# Patient Record
Sex: Female | Born: 1975 | Race: White | Hispanic: No | Marital: Single | Smoking: Current every day smoker
Health system: Southern US, Community
[De-identification: ages and names within clinical notes are randomized; demographics above are authoritative.]

## PROBLEM LIST (undated history)

## (undated) DIAGNOSIS — D3500 Benign neoplasm of unspecified adrenal gland: Secondary | ICD-10-CM

---

## 2001-12-20 ENCOUNTER — Ambulatory Visit: Admission: RE | Admit: 2001-12-20 | Discharge: 2001-12-20 | Payer: Self-pay | Admitting: Gynecology

## 2016-01-20 ENCOUNTER — Emergency Department (HOSPITAL_COMMUNITY): Payer: BLUE CROSS/BLUE SHIELD

## 2016-01-20 ENCOUNTER — Emergency Department (HOSPITAL_COMMUNITY)
Admission: EM | Admit: 2016-01-20 | Discharge: 2016-01-20 | Disposition: A | Discharge status: Home / Self Care | Payer: BLUE CROSS/BLUE SHIELD | Attending: Emergency Medicine | Admitting: Emergency Medicine

## 2016-01-20 ENCOUNTER — Encounter (HOSPITAL_COMMUNITY): Payer: Self-pay

## 2016-01-20 DIAGNOSIS — F17200 Nicotine dependence, unspecified, uncomplicated: Secondary | ICD-10-CM | POA: Diagnosis not present

## 2016-01-20 DIAGNOSIS — Y998 Other external cause status: Secondary | ICD-10-CM | POA: Insufficient documentation

## 2016-01-20 DIAGNOSIS — Y9389 Activity, other specified: Secondary | ICD-10-CM | POA: Diagnosis not present

## 2016-01-20 DIAGNOSIS — S29001A Unspecified injury of muscle and tendon of front wall of thorax, initial encounter: Secondary | ICD-10-CM | POA: Diagnosis not present

## 2016-01-20 DIAGNOSIS — R55 Syncope and collapse: Secondary | ICD-10-CM | POA: Diagnosis not present

## 2016-01-20 DIAGNOSIS — Y92410 Unspecified street and highway as the place of occurrence of the external cause: Secondary | ICD-10-CM | POA: Diagnosis not present

## 2016-01-20 DIAGNOSIS — M546 Pain in thoracic spine: Secondary | ICD-10-CM

## 2016-01-20 DIAGNOSIS — S0990XA Unspecified injury of head, initial encounter: Secondary | ICD-10-CM | POA: Insufficient documentation

## 2016-01-20 DIAGNOSIS — Z86018 Personal history of other benign neoplasm: Secondary | ICD-10-CM | POA: Diagnosis not present

## 2016-01-20 DIAGNOSIS — S8011XA Contusion of right lower leg, initial encounter: Secondary | ICD-10-CM | POA: Diagnosis not present

## 2016-01-20 DIAGNOSIS — S199XXA Unspecified injury of neck, initial encounter: Secondary | ICD-10-CM | POA: Diagnosis present

## 2016-01-20 DIAGNOSIS — S3992XA Unspecified injury of lower back, initial encounter: Secondary | ICD-10-CM | POA: Diagnosis not present

## 2016-01-20 DIAGNOSIS — S161XXA Strain of muscle, fascia and tendon at neck level, initial encounter: Secondary | ICD-10-CM | POA: Insufficient documentation

## 2016-01-20 HISTORY — DX: Benign neoplasm of unspecified adrenal gland: D35.00

## 2016-01-20 MED ORDER — TRAMADOL HCL 50 MG PO TABS: 50.0000 mg | ORAL_TABLET | Freq: Two times a day (BID) | ORAL | Status: AC | PRN

## 2016-01-20 MED ORDER — IBUPROFEN 800 MG PO TABS: 800.0000 mg | ORAL_TABLET | Freq: Three times a day (TID) | ORAL | Status: AC

## 2016-01-20 MED ORDER — CYCLOBENZAPRINE HCL 10 MG PO TABS: 10.0000 mg | ORAL_TABLET | Freq: Two times a day (BID) | ORAL | Status: AC | PRN

## 2016-01-20 MED ORDER — IBUPROFEN 400 MG PO TABS
800.0000 mg | ORAL_TABLET | Freq: Once | ORAL | Status: AC
  Administered 2016-01-20: 800 mg via ORAL
  Filled 2016-01-20: qty 2

## 2016-01-20 MED ORDER — CYCLOBENZAPRINE HCL 10 MG PO TABS
10.0000 mg | ORAL_TABLET | Freq: Once | ORAL | Status: AC
  Administered 2016-01-20: 10 mg via ORAL
  Filled 2016-01-20: qty 1
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
## 2016-01-20 NOTE — Discharge Instructions (Signed)
Back Pain, Adult °Back pain is very common in adults. The cause of back pain is rarely dangerous and the pain often gets better over time. The cause of your back pain may not be known. Some common causes of back pain include: °· Strain of the muscles or ligaments supporting the spine. °· Wear and tear (degeneration) of the spinal disks. °· Arthritis. °· Direct injury to the back. °For many people, back pain may return. Since back pain is rarely dangerous, most people can learn to manage this condition on their own. °HOME CARE INSTRUCTIONS °Watch your back pain for any changes. The following actions may help to lessen any discomfort you are feeling: °· Remain active. It is stressful on your back to sit or stand in one place for long periods of time. Do not sit, drive, or stand in one place for more than 30 minutes at a time. Take short walks on even surfaces as soon as you are able. Try to increase the length of time you walk each day. °· Exercise regularly as directed by your health care provider. Exercise helps your back heal faster. It also helps avoid future injury by keeping your muscles strong and flexible. °· Do not stay in bed. Resting more than 1-2 days can delay your recovery. °· Pay attention to your body when you bend and lift. The most comfortable positions are those that put less stress on your recovering back. Always use proper lifting techniques, including: °· Bending your knees. °· Keeping the load close to your body. °· Avoiding twisting. °· Find a comfortable position to sleep. Use a firm mattress and lie on your side with your knees slightly bent. If you lie on your back, put a pillow under your knees. °· Avoid feeling anxious or stressed. Stress increases muscle tension and can worsen back pain. It is important to recognize when you are anxious or stressed and learn ways to manage it, such as with exercise. °· Take medicines only as directed by your health care provider. Over-the-counter  medicines to reduce pain and inflammation are often the most helpful. Your health care provider may prescribe muscle relaxant drugs. These medicines help dull your pain so you can more quickly return to your normal activities and healthy exercise. °· Apply ice to the injured area: °· Put ice in a plastic bag. °· Place a towel between your skin and the bag. °· Leave the ice on for 20 minutes, 2-3 times a day for the first 2-3 days. After that, ice and heat may be alternated to reduce pain and spasms. °· Maintain a healthy weight. Excess weight puts extra stress on your back and makes it difficult to maintain good posture. °SEEK MEDICAL CARE IF: °· You have pain that is not relieved with rest or medicine. °· You have increasing pain going down into the legs or buttocks. °· You have pain that does not improve in one week. °· You have night pain. °· You lose weight. °· You have a fever or chills. °SEEK IMMEDIATE MEDICAL CARE IF:  °· You develop new bowel or bladder control problems. °· You have unusual weakness or numbness in your arms or legs. °· You develop nausea or vomiting. °· You develop abdominal pain. °· You feel faint. °  °This information is not intended to replace advice given to you by your health care provider. Make sure you discuss any questions you have with your health care provider. °  °Document Released: 08/16/2005 Document Revised: 09/06/2014 Document Reviewed: 12/18/2013 °Elsevier Interactive Patient Education ©2016 Elsevier   Inc[employer]  Cervical Sprain A cervical sprain is an injury in the neck in which the strong, fibrous tissues (ligaments) that connect your neck bones stretch or tear. Cervical sprains can range from mild to severe. Severe cervical sprains can cause the neck vertebrae to be unstable. This can lead to damage of the spinal cord and can result in serious nervous system problems. The amount of time it takes for a cervical sprain to get better depends on the cause and extent of the injury.  Most cervical sprains heal in 1 to 3 weeks. CAUSES  Severe cervical sprains may be caused by:   Contact sport injuries (such as from football, rugby, wrestling, hockey, auto racing, gymnastics, diving, martial arts, or boxing).   Motor vehicle collisions.   Whiplash injuries. This is an injury from a sudden forward and backward whipping movement of the head and neck.  Falls.  Mild cervical sprains may be caused by:   Being in an awkward position, such as while cradling a telephone between your ear and shoulder.   Sitting in a chair that does not offer proper support.   Working at a poorly Landscape architect station.   Looking up or down for long periods of time.  SYMPTOMS   Pain, soreness, stiffness, or a burning sensation in the front, back, or sides of the neck. This discomfort may develop immediately after the injury or slowly, 24 hours or more after the injury.   Pain or tenderness directly in the middle of the back of the neck.   Shoulder or upper back pain.   Limited ability to move the neck.   Headache.   Dizziness.   Weakness, numbness, or tingling in the hands or arms.   Muscle spasms.   Difficulty swallowing or chewing.   Tenderness and swelling of the neck.  DIAGNOSIS  Most of the time your health care provider can diagnose a cervical sprain by taking your history and doing a physical exam. Your health care provider will ask about previous neck injuries and any known neck problems, such as arthritis in the neck. X-rays may be taken to find out if there are any other problems, such as with the bones of the neck. Other tests, such as a CT scan or MRI, may also be needed.  TREATMENT  Treatment depends on the severity of the cervical sprain. Mild sprains can be treated with rest, keeping the neck in place (immobilization), and pain medicines. Severe cervical sprains are immediately immobilized. Further treatment is done to help with pain, muscle  spasms, and other symptoms and may include:  Medicines, such as pain relievers, numbing medicines, or muscle relaxants.   Physical therapy. This may involve stretching exercises, strengthening exercises, and posture training. Exercises and improved posture can help stabilize the neck, strengthen muscles, and help stop symptoms from returning.  HOME CARE INSTRUCTIONS   Put ice on the injured area.   Put ice in a plastic bag.   Place a towel between your skin and the bag.   Leave the ice on for 15-20 minutes, 3-4 times a day.   If your injury was severe, you may have been given a cervical collar to wear. A cervical collar is a two-piece collar designed to keep your neck from moving while it heals.  Do not remove the collar unless instructed by your health care provider.  If you have long hair, keep it outside of the collar.  Ask your health care provider before making any adjustments to  your collar. Minor adjustments may be required over time to improve comfort and reduce pressure on your chin or on the back of your head.  Ifyou are allowed to remove the collar for cleaning or bathing, follow your health care provider's instructions on how to do so safely.  Keep your collar clean by wiping it with mild soap and water and drying it completely. If the collar you have been given includes removable pads, remove them every 1-2 days and hand wash them with soap and water. Allow them to air dry. They should be completely dry before you wear them in the collar.  If you are allowed to remove the collar for cleaning and bathing, wash and dry the skin of your neck. Check your skin for irritation or sores. If you see any, tell your health care provider.  Do not drive while wearing the collar.   Only take over-the-counter or prescription medicines for pain, discomfort, or fever as directed by your health care provider.   Keep all follow-up appointments as directed by your health care  provider.   Keep all physical therapy appointments as directed by your health care provider.   Make any needed adjustments to your workstation to promote good posture.   Avoid positions and activities that make your symptoms worse.   Warm up and stretch before being active to help prevent problems.  SEEK MEDICAL CARE IF:   Your pain is not controlled with medicine.   You are unable to decrease your pain medicine over time as planned.   Your activity level is not improving as expected.  SEEK IMMEDIATE MEDICAL CARE IF:   You develop any bleeding.  You develop stomach upset.  You have signs of an allergic reaction to your medicine.   Your symptoms get worse.   You develop new, unexplained symptoms.   You have numbness, tingling, weakness, or paralysis in any part of your body.  MAKE SURE YOU:   Understand these instructions.  Will watch your condition.  Will get help right away if you are not doing well or get worse.   This information is not intended to replace advice given to you by your health care provider. Make sure you discuss any questions you have with your health care provider.   Document Released: 06/13/2007 Document Revised: 08/21/2013 Document Reviewed: 02/21/2013 Elsevier Interactive Patient Education 2016 [place] A contusion is a deep bruise. Contusions are the result of a blunt injury to tissues and muscle fibers under the skin. The injury causes bleeding under the skin. The skin overlying the contusion may turn blue, purple, or yellow. Minor injuries will give you a painless contusion, but more severe contusions may stay painful and swollen for a few weeks.  CAUSES  This condition is usually caused by a blow, trauma, or direct force to an area of the body. SYMPTOMS  Symptoms of this condition include:  Swelling of the injured area.  Pain and tenderness in the injured area.  Discoloration. The area may have redness and  then turn blue, purple, or yellow. DIAGNOSIS  This condition is diagnosed based on a physical exam and medical history. An X-ray, CT scan, or MRI may be needed to determine if there are any associated injuries, such as broken bones (fractures). TREATMENT  Specific treatment for this condition depends on what area of the body was injured. In general, the best treatment for a contusion is resting, icing, applying pressure to (compression), and elevating the injured area.  This is often called the RICE strategy. Over-the-counter anti-inflammatory medicines may also be recommended for pain control.  HOME CARE INSTRUCTIONS   Rest the injured area.  If directed, apply ice to the injured area:  Put ice in a plastic bag.  Place a towel between your skin and the bag.  Leave the ice on for 20 minutes, 2-3 times per day.  If directed, apply light compression to the injured area using an elastic bandage. Make sure the bandage is not wrapped too tightly. Remove and reapply the bandage as directed by your health care provider.  If possible, raise (elevate) the injured area above the level of your heart while you are sitting or lying down.  Take over-the-counter and prescription medicines only as told by your health care provider. SEEK MEDICAL CARE IF:  Your symptoms do not improve after several days of treatment.  Your symptoms get worse.  You have difficulty moving the injured area. SEEK IMMEDIATE MEDICAL CARE IF:   You have severe pain.  You have numbness in a hand or foot.  Your hand or foot turns pale or cold.   This information is not intended to replace advice given to you by your health care provider. Make sure you discuss any questions you have with your health care provider.   Document Released: 05/26/2005 Document Revised: 05/07/2015 Document Reviewed: 01/01/2015 Elsevier Interactive Patient Education 2016 [employer].  Technical brewer It is common to have multiple  bruises and sore muscles after a motor vehicle collision (MVC). These tend to feel worse for the first 24 hours. You may have the most stiffness and soreness over the first several hours. You may also feel worse when you wake up the first morning after your collision. After this point, you will usually begin to improve with each day. The speed of improvement often depends on the severity of the collision, the number of injuries, and the location and nature of these injuries. HOME CARE INSTRUCTIONS  Put ice on the injured area.  Put ice in a plastic bag.  Place a towel between your skin and the bag.  Leave the ice on for 15-20 minutes, 3-4 times a day, or as directed by your health care provider.  Drink enough fluids to keep your urine clear or pale yellow. Do not drink alcohol.  Take a warm shower or bath once or twice a day. This will increase blood flow to sore muscles.  You may return to activities as directed by your caregiver. Be careful when lifting, as this may aggravate neck or back pain.  Only take over-the-counter or prescription medicines for pain, discomfort, or fever as directed by your caregiver. Do not use aspirin. This may increase bruising and bleeding. SEEK IMMEDIATE MEDICAL CARE IF:  You have numbness, tingling, or weakness in the arms or legs.  You develop severe headaches not relieved with medicine.  You have severe neck pain, especially tenderness in the middle of the back of your neck.  You have changes in bowel or bladder control.  There is increasing pain in any area of the body.  You have shortness of breath, light-headedness, dizziness, or fainting.  You have chest pain.  You feel sick to your stomach (nauseous), throw up (vomit), or sweat.  You have increasing abdominal discomfort.  There is blood in your urine, stool, or vomit.  You have pain in your shoulder (shoulder strap areas).  You feel your symptoms are getting worse. MAKE SURE  YOU:  Understand  these instructions.  Will watch your condition.  Will get help right away if you are not doing well or get worse.   This information is not intended to replace advice given to you by your health care provider. Make sure you discuss any questions you have with your health care provider.   Document Released: 08/16/2005 Document Revised: 09/06/2014 Document Reviewed: 01/13/2011 Elsevier Interactive Patient Education [employer].

## 2016-01-20 NOTE — ED Notes (Signed)
Pt is in stable condition upon d/c and ambulates from ED. 
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
## 2016-05-15 ENCOUNTER — Emergency Department (HOSPITAL_COMMUNITY)
Admission: EM | Admit: 2016-05-15 | Discharge: 2016-05-15 | Disposition: A | Discharge status: Home / Self Care | Payer: BLUE CROSS/BLUE SHIELD | Attending: Emergency Medicine | Admitting: Emergency Medicine

## 2016-05-15 ENCOUNTER — Encounter (HOSPITAL_COMMUNITY): Payer: Self-pay

## 2016-05-15 DIAGNOSIS — R51 Headache: Secondary | ICD-10-CM | POA: Diagnosis not present

## 2016-05-15 DIAGNOSIS — G8918 Other acute postprocedural pain: Secondary | ICD-10-CM | POA: Insufficient documentation

## 2016-05-15 DIAGNOSIS — F17200 Nicotine dependence, unspecified, uncomplicated: Secondary | ICD-10-CM | POA: Diagnosis not present

## 2016-05-15 DIAGNOSIS — G971 Other reaction to spinal and lumbar puncture: Secondary | ICD-10-CM

## 2016-05-15 LAB — COMPREHENSIVE METABOLIC PANEL
ALBUMIN: 3.2 g/dL — AB (ref 3.5–5.0)
ALK PHOS: 58 U/L (ref 38–126)
ALT: 20 U/L (ref 14–54)
AST: 22 U/L (ref 15–41)
Anion gap: 9 (ref 5–15)
BUN: 10 mg/dL (ref 6–20)
CALCIUM: 9 mg/dL (ref 8.9–10.3)
CHLORIDE: 105 mmol/L (ref 101–111)
CO2: 25 mmol/L (ref 22–32)
CREATININE: 1.08 mg/dL — AB (ref 0.44–1.00)
GFR calc non Af Amer: >60 mL/min (ref >60)
GLUCOSE: 104 mg/dL — AB (ref 65–99)
Potassium: 3.2 mmol/L — ABNORMAL LOW (ref 3.5–5.1)
SODIUM: 139 mmol/L (ref 135–145)
Total Bilirubin: 0.3 mg/dL (ref 0.3–1.2)
Total Protein: 6.5 g/dL (ref 6.5–8.1)

## 2016-05-15 LAB — CBC WITH DIFFERENTIAL/PLATELET
BASOS PCT: 0 %
Basophils Absolute: 0 10*3/uL (ref 0.0–0.1)
EOS PCT: 1 %
Eosinophils Absolute: 0.1 10*3/uL (ref 0.0–0.7)
HEMATOCRIT: 43.2 % (ref 36.0–46.0)
HEMOGLOBIN: 14.5 g/dL (ref 12.0–15.0)
LYMPHS PCT: 28 %
Lymphs Abs: 2.9 10*3/uL (ref 0.7–4.0)
MCH: 35.8 pg — AB (ref 26.0–34.0)
MCHC: 33.6 g/dL (ref 30.0–36.0)
MCV: 106.7 fL — ABNORMAL HIGH (ref 78.0–100.0)
MONOS PCT: 9 %
Monocytes Absolute: 0.9 10*3/uL (ref 0.1–1.0)
NEUTROS ABS: 6.6 10*3/uL (ref 1.7–7.7)
NEUTROS PCT: 62 %
Platelets: 307 10*3/uL (ref 150–400)
RBC: 4.05 MIL/uL (ref 3.87–5.11)
RDW: 14.1 % (ref 11.5–15.5)
WBC: 10.5 10*3/uL (ref 4.0–10.5)

## 2016-05-15 LAB — I-STAT BETA HCG BLOOD, ED (MC, WL, AP ONLY): I-stat hCG, quantitative: <5 m[IU]/mL (ref <5)

## 2016-05-15 LAB — I-STAT CG4 LACTIC ACID, ED: Lactic Acid, Venous: 1.64 mmol/L (ref 0.5–1.9)

## 2016-05-15 MED ORDER — SODIUM CHLORIDE 0.9 % IV BOLUS (SEPSIS)
1000.0000 mL | Freq: Once | INTRAVENOUS | Status: AC
Start: 2016-05-15 — End: 2016-05-15
  Administered 2016-05-15: 1000 mL via INTRAVENOUS

## 2016-05-15 MED ORDER — POTASSIUM CHLORIDE CRYS ER 20 MEQ PO TBCR
40.0000 meq | EXTENDED_RELEASE_TABLET | Freq: Once | ORAL | Status: AC
  Administered 2016-05-15: 40 meq via ORAL
  Filled 2016-05-15: qty 2

## 2016-05-15 MED ORDER — SODIUM CHLORIDE 0.9 % IV SOLN
500.0000 mg | Freq: Once | INTRAVENOUS | Status: AC
  Administered 2016-05-15: 500 mg via INTRAVENOUS
  Filled 2016-05-15: qty 2

## 2016-05-15 NOTE — ED Provider Notes (Signed)
[place] DEPT Provider Note   CSN: [id number] Arrival date & time: 05/15/16  1620     History   Chief Complaint Chief Complaint  Patient presents with  . Post-op Problem    HPI Courtney Casey is a 40 y.o. female.  HPI   Courtney Casey is a 40 y.o. female, with a history of Benign adrenal tumor, presenting to the ED with Bilateral headache upon sitting up. Patient had lumbar puncture for meningitis rule out on September 13. Procedure was performed at [facility]. LP negative for meningitis. Pain feels like a pressure, rates it 6 out of 10, only present when she sits upright. When laying flat, patient has no pain. Denies fever/chills, neuro deficits, nausea/vomiting, or any other complaints.     Past Medical History:  Diagnosis Date  . Adrenal benign tumor     There are no active problems to display for this patient.   History reviewed. No pertinent surgical history.  OB History    No data available       Home Medications    Prior to Admission medications   Medication Sig Start Date End Date Taking? Authorizing Provider  cyclobenzaprine (FLEXERIL) 10 MG tablet Take 1 tablet (10 mg total) by mouth 2 (two) times daily as needed for muscle spasms. 01/20/16   Delsa Grana, PA-C  ibuprofen (ADVIL,MOTRIN) 800 MG tablet Take 1 tablet (800 mg total) by mouth 3 (three) times daily. 01/20/16   Delsa Grana, PA-C  traMADol (ULTRAM) 50 MG tablet Take 1 tablet (50 mg total) by mouth every 12 (twelve) hours as needed for severe pain. 01/20/16   Delsa Grana, PA-C    Family History History reviewed. No pertinent family history.  Social History Social History  Substance Use Topics  . Smoking status: Current Every Day Smoker    Packs/day: 1.00  . Smokeless tobacco: Never Used  . Alcohol use No     Allergies   Review of patient's allergies indicates no known allergies.   Review of Systems Review of Systems  Constitutional: Negative for chills, diaphoresis  and fever.  Respiratory: Negative for shortness of breath.   Cardiovascular: Negative for chest pain.  Gastrointestinal: Negative for nausea and vomiting.  Neurological: Positive for headaches. Negative for dizziness and light-headedness.  All other systems reviewed and are negative.    Physical Exam Updated Vital Signs BP 133/93 (BP Location: Left Arm)   Pulse (!) 125   Temp 98.7 F (37.1 C) (Oral)   Resp 18   Ht 5\' 5"  (1.651 m)   Wt 81.2 kg   LMP 05/14/2016 (Within Days)   SpO2 98%   BMI 29.79 kg/m   Physical Exam  Constitutional: She is oriented to person, place, and time. She appears well-developed and well-nourished. No distress.  HENT:  Head: Normocephalic and atraumatic.  Eyes: Conjunctivae and EOM are normal. Pupils are equal, round, and reactive to light.  Neck: Neck supple.  Cardiovascular: Normal rate, regular rhythm, normal heart sounds and intact distal pulses.   Pulmonary/Chest: Effort normal and breath sounds normal. No respiratory distress.  Abdominal: Soft. There is no tenderness. There is no guarding.  Musculoskeletal: She exhibits no edema or tenderness.  Full ROM in all extremities and spine. No midline spinal tenderness.   Lymphadenopathy:    She has no cervical adenopathy.  Neurological: She is alert and oriented to person, place, and time. She has normal reflexes.  Patient begins to have a headache when placed in the upright position. No sensory deficits.  Strength 5/5 in all extremities. No gait disturbance. Coordination intact. Cranial nerves III-XII grossly intact. No facial droop.   Skin: Skin is warm and dry. She is not diaphoretic.  Site of the LP was inspected. No swelling, tenderness, erythema, or fluid leakage noted.  Psychiatric: She has a normal mood and affect. Her behavior is normal.  Nursing note and vitals reviewed.    ED Treatments / Results  Labs (all labs ordered are listed, but only abnormal results are displayed) Labs Reviewed   COMPREHENSIVE METABOLIC PANEL - Abnormal; Notable for the following:       Result Value   Potassium 3.2 (*)    Glucose, Bld 104 (*)    Creatinine, Ser 1.08 (*)    Albumin 3.2 (*)    All other components within normal limits  CBC WITH DIFFERENTIAL/PLATELET - Abnormal; Notable for the following:    MCV 106.7 (*)    MCH 35.8 (*)    All other components within normal limits  I-STAT BETA HCG BLOOD, ED ([facility], [facility], [facility] ONLY)  I-STAT CG4 LACTIC ACID, ED    EKG  EKG Interpretation None       Radiology No results found.  Procedures Procedures (including critical care time)  Medications Ordered in ED Medications  caffeine-sodium benzoate ADULT 500 mg in sodium chloride 0.9 % 1,000 mL IVPB (500 mg Intravenous Given 05/15/16 2026)  potassium chloride SA (K-DUR,KLOR-CON) CR tablet 40 mEq (40 mEq Oral Given 05/15/16 1912)  sodium chloride 0.9 % bolus 1,000 mL (1,000 mLs Intravenous New Bag/Given 05/15/16 1948)     Initial Impression / Assessment and Plan / ED Course  I have reviewed the triage vital signs and the nursing notes.  Pertinent labs & imaging results that were available during my care of the patient were reviewed by me and considered in my medical decision making (see chart for details).  Clinical Course    Patient presents with positional headache for the last 3 days following a LP.  Patient has tried to return to [facility] for an epidural blood patch to be placed, however, when she has gone back to the hospital, they have told her that the proper personnel are not available.  7:27 PM Spoke with Dr. Geroge Baseman, Interventional Radiologist, who states that I will need to contact one of the Neuro radiologists. Will want to know if patient has done 48 hours of complete bed rest, tylenol/ibuprofen, and caffeine. 7:39 PM Spoke with Dr. Nevada Crane, Neuro Radiologist, who states pt will have to have the patch done on Monday Sept 18. Will need to have the patch done at the outpatient imaging  center. Have the patient call [phone]  at 7 AM. Will need to be worked into the schedule, usually within 2 hours.  End of shift patient care handoff report given to Antonietta Breach, PA-C. Plan: Finish IV fluids and caffeine infusion. Discharge with instructions for contacting the [facility]:   05/15/16 1624 05/15/16 1625 05/15/16 1858 05/15/16 1901  BP:  133/93 122/85 122/85  Pulse:  (!) 125 112   Resp:  18 15 26   Temp:  98.7 F (37.1 C) 98.3 F (36.8 C)   TempSrc:  Oral Oral   SpO2:  98% 100% 100%  Weight: 81.2 kg     Height: 5\' 5"  (1.651 m)         Final Clinical Impressions(s) / ED Diagnoses   Final diagnoses:  Post lumbar puncture headache    New Prescriptions New Prescriptions  No medications on file     Layla Maw 05/15/16 2028    Malvin Johns, MD 05/15/16 [phone]

## 2016-05-15 NOTE — ED Notes (Signed)
Nurse starting IV will get labs if needed

## 2016-05-15 NOTE — ED Triage Notes (Signed)
Pt was seen at [facility] for ongoing headaches. Spinal tap preformed and pt was tested for meningitis which was negative. Pt reports continuing headaches and reports she is "leaking spinal fluid." Incision site clean.

## 2016-05-15 NOTE — Discharge Instructions (Signed)
Continue complete bed rest, caffeine use, and the use of Tylenol or ibuprofen. Call the number provided to set up an appointment. Call at 7 AM on Monday, September 18.

## 2016-05-17 ENCOUNTER — Other Ambulatory Visit: Payer: Self-pay | Admitting: Internal Medicine

## 2016-05-17 ENCOUNTER — Ambulatory Visit
Admission: RE | Admit: 2016-05-17 | Discharge: 2016-05-17 | Disposition: A | Discharge status: Home / Self Care | Payer: BLUE CROSS/BLUE SHIELD | Source: Ambulatory Visit | Attending: Internal Medicine | Admitting: Internal Medicine

## 2016-05-17 DIAGNOSIS — G971 Other reaction to spinal and lumbar puncture: Secondary | ICD-10-CM

## 2016-05-17 MED ORDER — IOPAMIDOL (ISOVUE-M 200) INJECTION 41%
1.0000 mL | Freq: Once | INTRAMUSCULAR | Status: AC
  Administered 2016-05-17: 1 mL via EPIDURAL

## 2016-05-17 NOTE — Discharge Instructions (Signed)

## 2016-11-15 ENCOUNTER — Other Ambulatory Visit: Payer: Self-pay | Admitting: Internal Medicine

## 2016-11-15 DIAGNOSIS — T148XXA Other injury of unspecified body region, initial encounter: Secondary | ICD-10-CM

## 2016-11-17 ENCOUNTER — Ambulatory Visit
Admission: RE | Admit: 2016-11-17 | Discharge: 2016-11-17 | Disposition: A | Discharge status: Home / Self Care | Payer: 59 | Source: Ambulatory Visit | Attending: Internal Medicine | Admitting: Internal Medicine

## 2016-11-17 DIAGNOSIS — T148XXA Other injury of unspecified body region, initial encounter: Secondary | ICD-10-CM

## 2019-08-13 ENCOUNTER — Emergency Department (HOSPITAL_COMMUNITY)
Admission: EM | Admit: 2019-08-13 | Discharge: 2019-08-13 | Disposition: A | Discharge status: Home / Self Care | Payer: 59 | Attending: Emergency Medicine | Admitting: Emergency Medicine

## 2019-08-13 ENCOUNTER — Emergency Department (HOSPITAL_COMMUNITY): Payer: 59

## 2019-08-13 ENCOUNTER — Other Ambulatory Visit: Payer: Self-pay

## 2019-08-13 ENCOUNTER — Encounter (HOSPITAL_COMMUNITY): Payer: Self-pay

## 2019-08-13 DIAGNOSIS — H547 Unspecified visual loss: Secondary | ICD-10-CM | POA: Diagnosis present

## 2019-08-13 DIAGNOSIS — F17210 Nicotine dependence, cigarettes, uncomplicated: Secondary | ICD-10-CM | POA: Diagnosis not present

## 2019-08-13 DIAGNOSIS — Z79899 Other long term (current) drug therapy: Secondary | ICD-10-CM | POA: Insufficient documentation

## 2019-08-13 DIAGNOSIS — R55 Syncope and collapse: Secondary | ICD-10-CM

## 2019-08-13 DIAGNOSIS — R42 Dizziness and giddiness: Secondary | ICD-10-CM | POA: Insufficient documentation

## 2019-08-13 NOTE — Discharge Instructions (Signed)
Please return for any problem.  Follow-up with your regular care provider as instructed. °

## 2019-08-13 NOTE — ED Triage Notes (Signed)
Pt states she was driving this morning to work, took a puff on her cigarette and immediately lost her vision . Pt states that this lasted maybe 15 seconds. Pt states that she doe snot have a headache now, and has no visual issues at this time. Pt ambulatory without any dizziness or issues.

## 2019-08-13 NOTE — ED Provider Notes (Signed)
[place] DEPT Provider Note   CSN: [id number] Arrival date & time: 08/13/19  [id number]     History Chief Complaint  Patient presents with  . Loss of Vision    Courtney Casey is a 43 y.o. female.  43 year old female with prior medical history as detailed below presents for evaluation of transient near syncopal episode.  Patient reports that at 8 AM this morning she was driving to work.  She suddenly experienced tingling to the neck, face, and head.  Seconds after this " tingling" she felt that her vision narrowed.  She reports blackness in her peripheral vision.  This then transition to her only seen " light."  Symptoms lasted approximately 5 to 10 seconds.  She did not pass out.  She did feel as though she was about to pass out.  She denies associated chest pain, palpitations, shortness of breath, headache, nausea, vomiting, or other specific complaint.  After this transient episode that lasted at most 30 seconds, she then drove to our facility for evaluation.  She has been asymptomatic since.  She denies any current symptoms.    The history is provided by the patient and medical records.  Illness Location:  Transient near syncope Severity:  Mild Onset quality:  Sudden Duration:  7 hours Timing:  Rare Progression:  Resolved Chronicity:  New Associated symptoms: no chest pain, no fever, no headaches, no loss of consciousness, no rhinorrhea, no shortness of breath and no sore throat        Past Medical History:  Diagnosis Date  . Adrenal benign tumor     There are no problems to display for this patient.   History reviewed. No pertinent surgical history.   OB History   No obstetric history on file.     No family history on file.  Social History   Tobacco Use  . Smoking status: Current Every Day Smoker    Packs/day: 1.00  . Smokeless tobacco: Never Used  Substance Use Topics  . Alcohol use: No  . Drug use: Not on file    Home  Medications Prior to Admission medications   Medication Sig Start Date End Date Taking? Authorizing Provider  cyclobenzaprine (FLEXERIL) 10 MG tablet Take 1 tablet (10 mg total) by mouth 2 (two) times daily as needed for muscle spasms. Patient not taking: Reported on 05/15/2016 01/20/16   Delsa Grana, PA-C  ibuprofen (ADVIL,MOTRIN) 800 MG tablet Take 1 tablet (800 mg total) by mouth 3 (three) times daily. Patient taking differently: Take 800 mg by mouth every 6 (six) hours.  01/20/16   Delsa Grana, PA-C  methocarbamol (ROBAXIN) 750 MG tablet Take 750 mg by mouth every 6 (six) hours as needed for muscle pain. 05/11/16   [provider]  sulfamethoxazole-trimethoprim (BACTRIM DS,SEPTRA DS) 800-160 MG tablet Take 1 tablet by mouth 2 (two) times daily. For 10 days (Start date 05/15/16) 05/15/16   [provider]  traMADol (ULTRAM) 50 MG tablet Take 1 tablet (50 mg total) by mouth every 12 (twelve) hours as needed for severe pain. Patient not taking: Reported on 05/15/2016 01/20/16   Delsa Grana, PA-C    Allergies    Varenicline  Review of Systems   Review of Systems  Constitutional: Negative for fever.  HENT: Negative for rhinorrhea and sore throat.   Respiratory: Negative for shortness of breath.   Cardiovascular: Negative for chest pain.  Neurological: Negative for loss of consciousness and headaches.  All other systems reviewed and are negative.  Physical Exam Updated Vital Signs BP 117/88 (BP Location: Left Arm)   Pulse (!) 123   Temp 98.3 F (36.8 C) (Oral)   Resp 16   Ht 5\' 5"  (1.651 m)   Wt 81.6 kg   SpO2 98%   BMI 29.95 kg/m   Physical Exam Vitals and nursing note reviewed.  Constitutional:      General: She is not in acute distress.    Appearance: Normal appearance. She is well-developed.  HENT:     Head: Normocephalic and atraumatic.     Nose: Nose normal.  Eyes:     Conjunctiva/sclera: Conjunctivae normal.     Pupils: Pupils are equal, round, and  reactive to light.  Cardiovascular:     Rate and Rhythm: Normal rate and regular rhythm.     Heart sounds: Normal heart sounds.  Pulmonary:     Effort: Pulmonary effort is normal. No respiratory distress.     Breath sounds: Normal breath sounds.  Abdominal:     General: Abdomen is flat. There is no distension.     Palpations: Abdomen is soft.     Tenderness: There is no abdominal tenderness.  Musculoskeletal:        General: No deformity. Normal range of motion.     Cervical back: Normal range of motion and neck supple.  Skin:    General: Skin is warm and dry.  Neurological:     General: No focal deficit present.     Mental Status: She is alert and oriented to person, place, and time. Mental status is at baseline.     Cranial Nerves: No cranial nerve deficit.     Sensory: No sensory deficit.     Motor: No weakness.     Coordination: Coordination normal.     ED Results / Procedures / Treatments   Labs (all labs ordered are listed, but only abnormal results are displayed) Labs Reviewed - No data to display  EKG None  Radiology CT Head Wo Contrast  Result Date: 08/13/2019 CLINICAL DATA:  Vision loss. EXAM: CT HEAD WITHOUT CONTRAST TECHNIQUE: Contiguous axial images were obtained from the base of the skull through the vertex without intravenous contrast. COMPARISON:  05/12/2016 FINDINGS: Brain: No evidence of acute infarction, hemorrhage, hydrocephalus, extra-axial collection or mass lesion/mass effect. Vascular: No hyperdense vessel or unexpected calcification. Skull: Normal. Negative for fracture or focal lesion. Sinuses/Orbits: No acute finding. Other: None. IMPRESSION: No acute intracranial abnormality Electronically Signed   By: Zetta Bills M.D.   On: 08/13/2019 10:56    Procedures Procedures (including critical care time)  Medications Ordered in ED Medications - No data to display  ED Course  I have reviewed the triage vital signs and the nursing  notes.  Pertinent labs & imaging results that were available during my care of the patient were reviewed by me and considered in my medical decision making (see chart for details).    MDM Rules/Calculators/A&P                      MDM  Screen complete  Courtney Casey was evaluated in Emergency Department on 08/13/2019 for the symptoms described in the history of present illness. She was evaluated in the context of the global COVID-19 pandemic, which necessitated consideration that the patient might be at risk for infection with the SARS-CoV-2 virus that causes COVID-19. Institutional protocols and algorithms that pertain to the evaluation of patients at risk for COVID-19 are in a state of rapid  change based on information released by regulatory bodies including the CDC and federal and state organizations. These policies and algorithms were followed during the patient's care in the ED.   Patient is presenting for evaluation of reported near syncopal symptoms.  Symptoms were transient.  She has not had further complaints since they occurred approximately 7 hours prior.  CT imaging performed in the ED does not show any acute process.  Patient was offered additional laboratory testing (labs, ekg, etc) and observation by myself.  She declined same.    She desires discharge.  She does understand the need for close follow-up.  Strict return precautions given and understood.    Final Clinical Impression(s) / ED Diagnoses Final diagnoses:  Near syncope    Rx / DC Orders ED Discharge Orders    None       Valarie Merino, MD 08/13/19 1601

## 2020-03-19 IMAGING — CT CT HEAD W/O CM
3 series · 16 of 47 positions shown, 19 images · non-contrast
Comparison: 05/12/2016

CLINICAL DATA: Vision loss.

EXAM:
CT HEAD WITHOUT CONTRAST
TECHNIQUE: Contiguous axial images were obtained from the base of the skull
through the vertex without intravenous contrast.

[Series 2: head wo · axial · 0.45mm/px · z∈[+1209,+1334]mm · 10 of 30 slices shown, 13 images]
[im 3/30  brain]
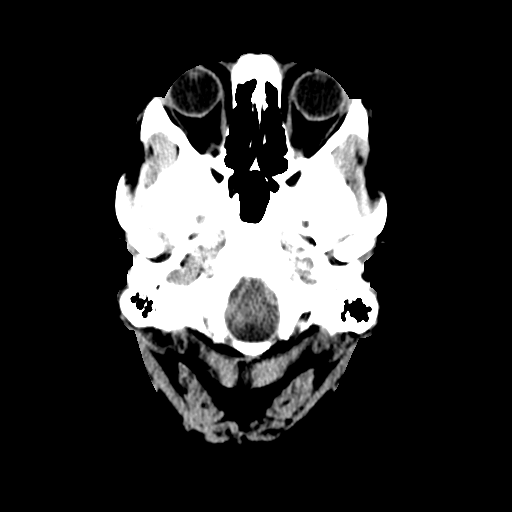
[im 3/30  bone]
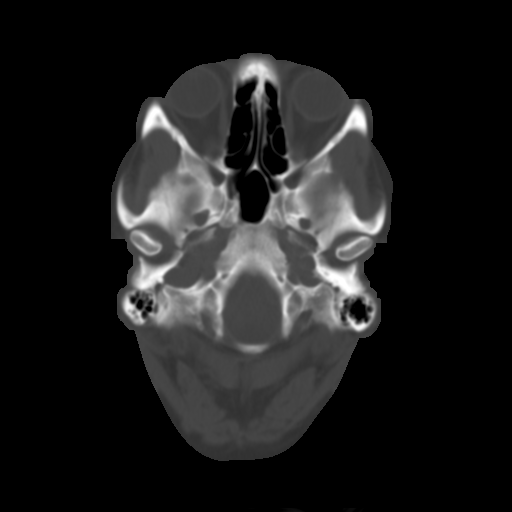
[im 6/30  brain]
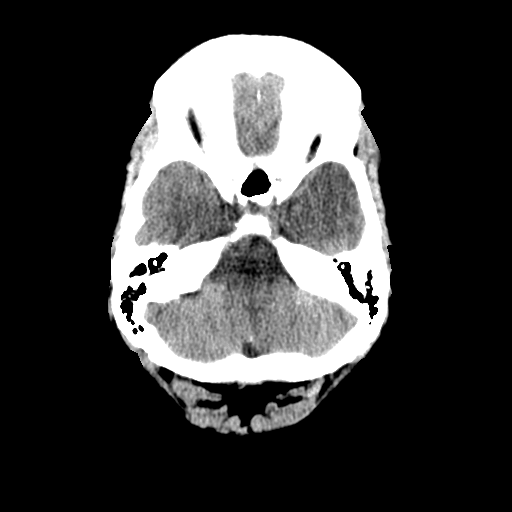
[im 9/30  brain]
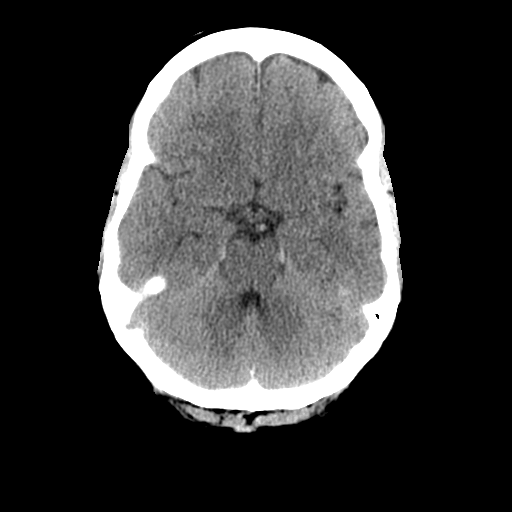
[im 11/30  brain]
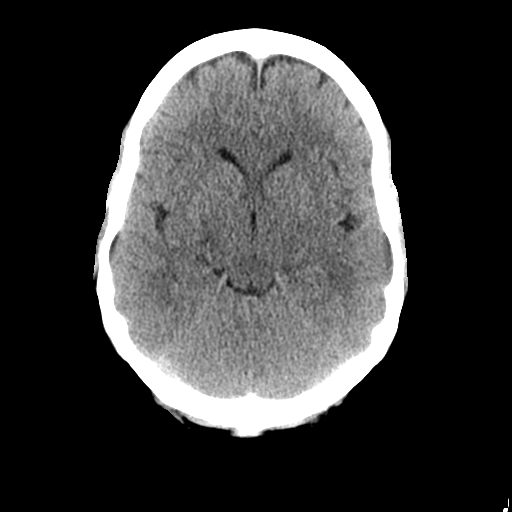
[im 14/30  brain]
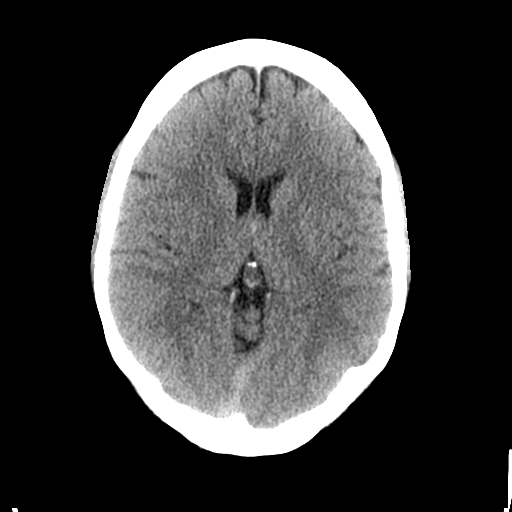
[im 14/30  bone]
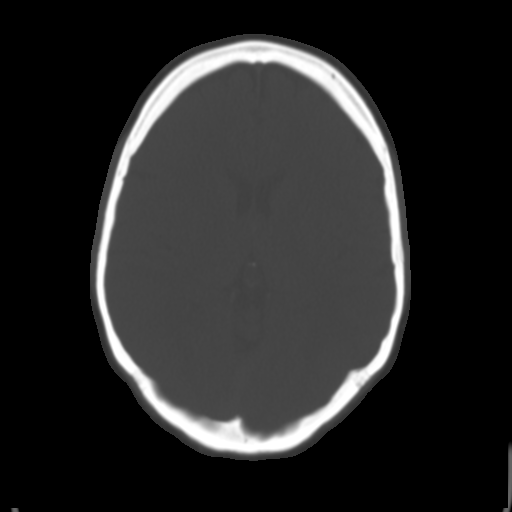
[im 17/30  brain]
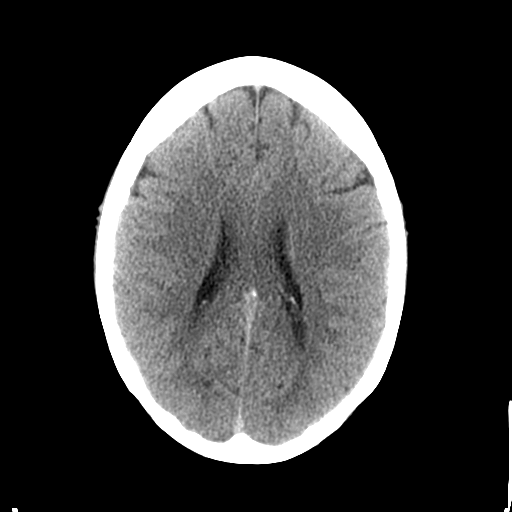
[im 20/30  brain]
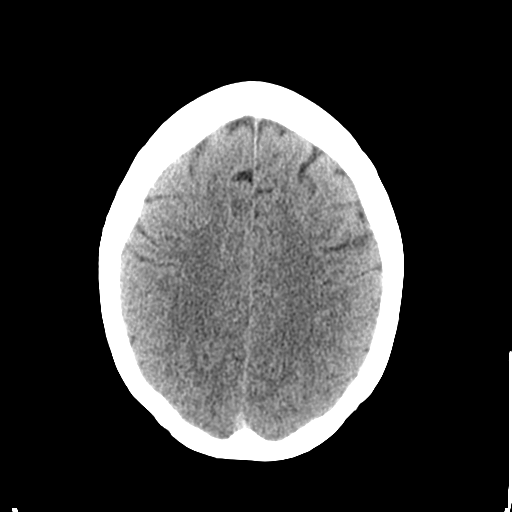
[im 23/30  brain]
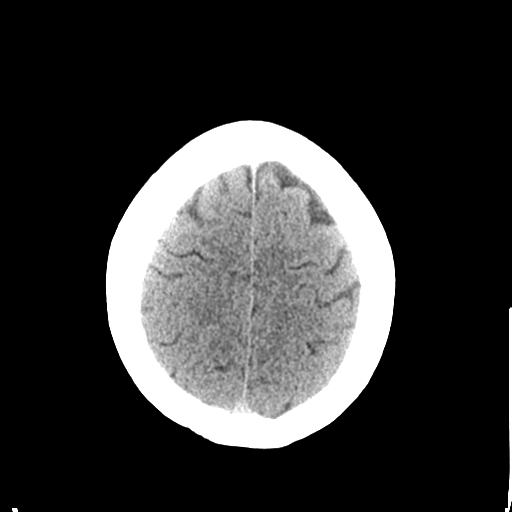
[im 25/30  brain]
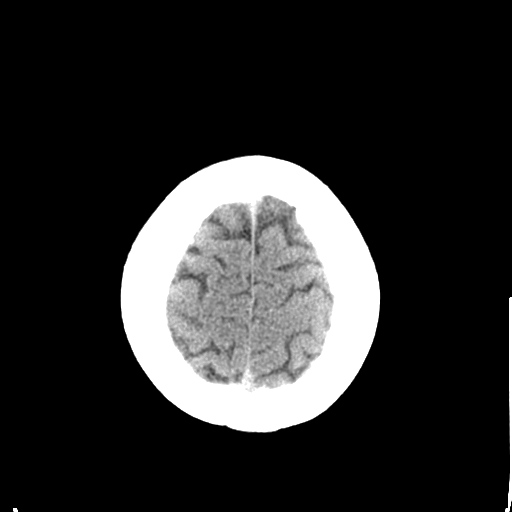
[im 25/30  bone]
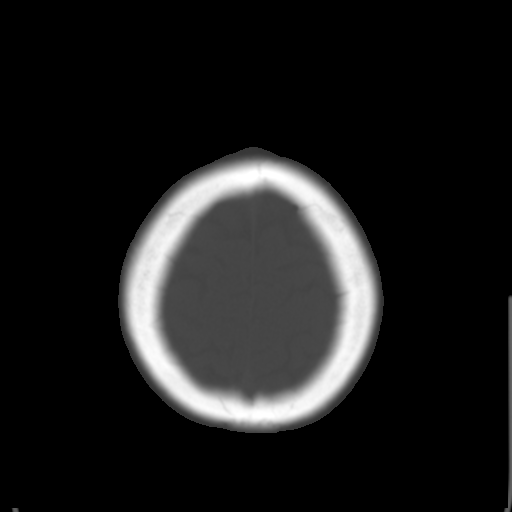
[im 28/30  brain]
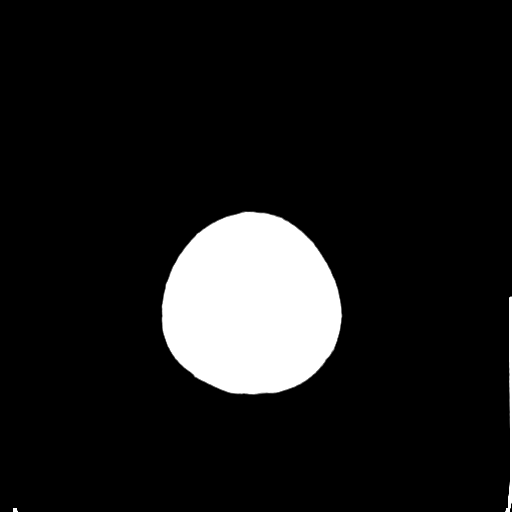

[Series 4: coronal soft tissue · coronal · 0.32mm/px · 3 of 75 slices shown]
[im 25/75  brain]
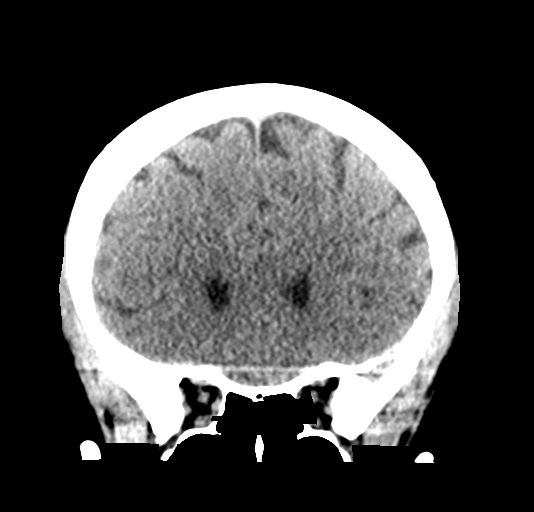
[im 33/75  brain]
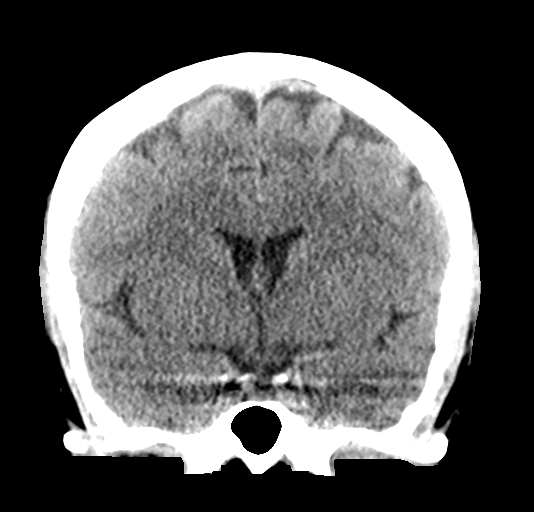
[im 42/75  brain]
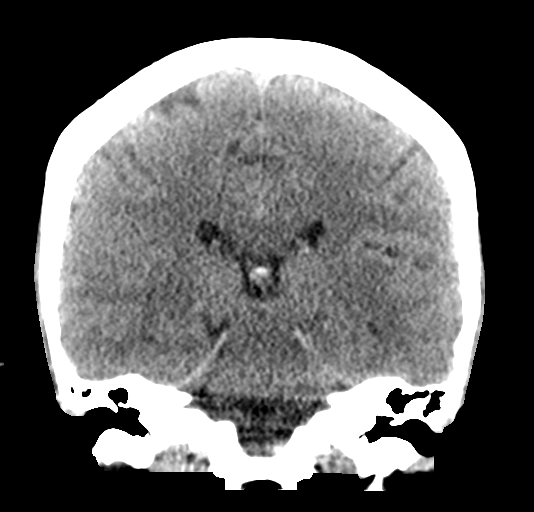

[Series 5: sagittal soft tissue · sagittal · 0.33mm/px · 3 of 57 slices shown]
[im 19/57  brain]
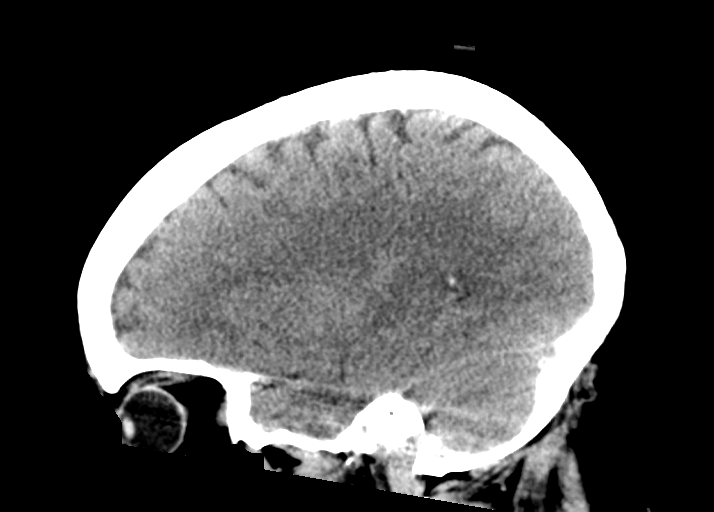
[im 29/57  brain]
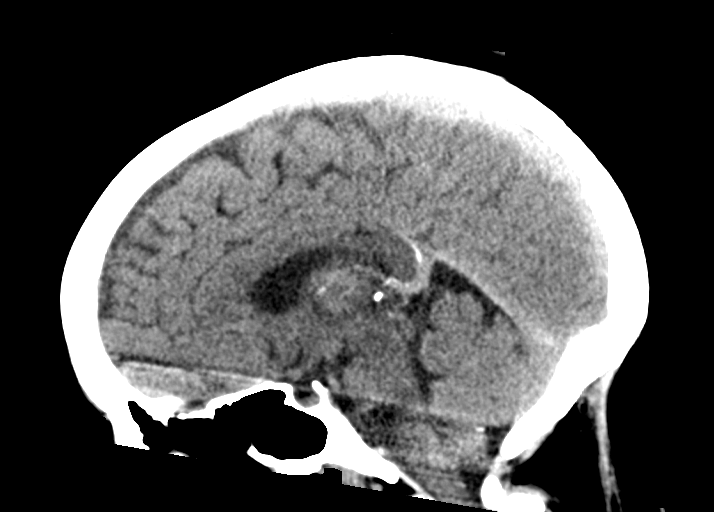
[im 38/57  brain]
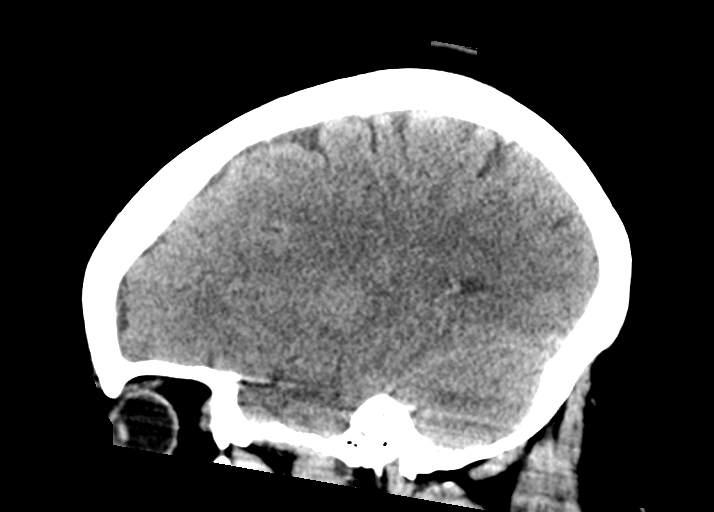

[16 of 47 positions shown; findings below may reference images not displayed]

FINDINGS: Brain: No evidence of acute infarction, hemorrhage, hydrocephalus,
extra-axial collection or mass lesion/mass effect.

Vascular: No hyperdense vessel or unexpected calcification.

Skull: Normal. Negative for fracture or focal lesion.

Sinuses/Orbits: No acute finding.

Other: None.
IMPRESSION: No acute intracranial abnormality

## 2020-06-03 ENCOUNTER — Encounter: Payer: Self-pay | Admitting: *Deleted
